# Patient Record
Sex: Male | Born: 1955 | Race: White | Hispanic: No | Marital: Married | Smoking: Current every day smoker
Health system: Southern US, Community
[De-identification: ages and names within clinical notes are randomized; demographics above are authoritative.]

---

## 2009-11-16 ENCOUNTER — Ambulatory Visit: Payer: Self-pay | Admitting: Urology

## 2011-07-23 IMAGING — CT CT ABDOMEN AND PELVIS WITHOUT AND WITH CONTRAST
2 of 4 series · 14 of 32 positions shown, 19 images · non-contrast
Comparison: none

REASON FOR EXAM: hematuria
COMMENTS:

[Series 2: wo · axial · 0.79mm/px · z∈[-492,-112]mm · 8 of 98 slices shown, 13 images]
[im 11/98  soft-tissue]
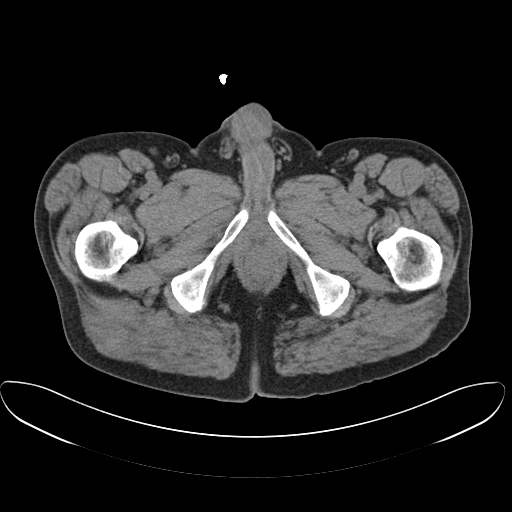
[im 11/98  bone]
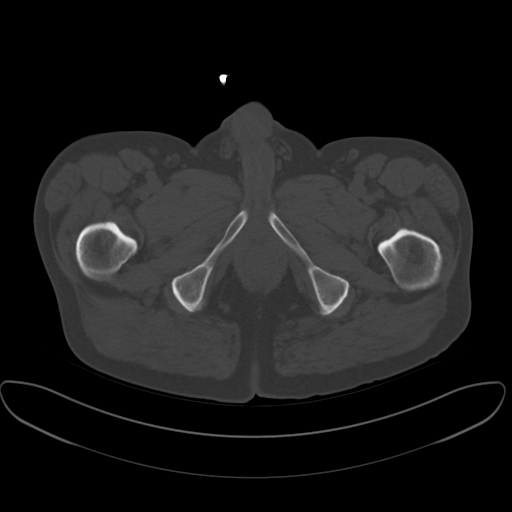
[im 22/98  soft-tissue]
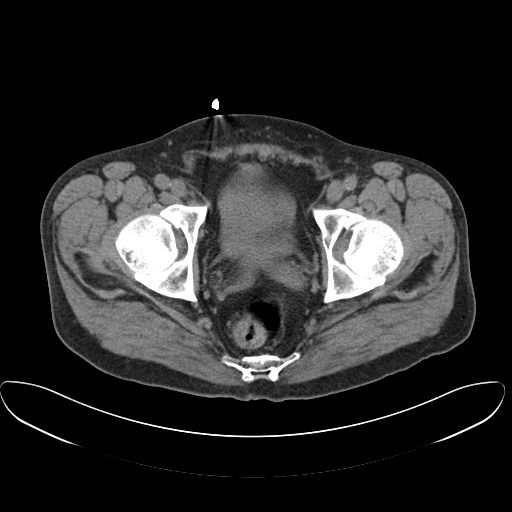
[im 33/98  soft-tissue]
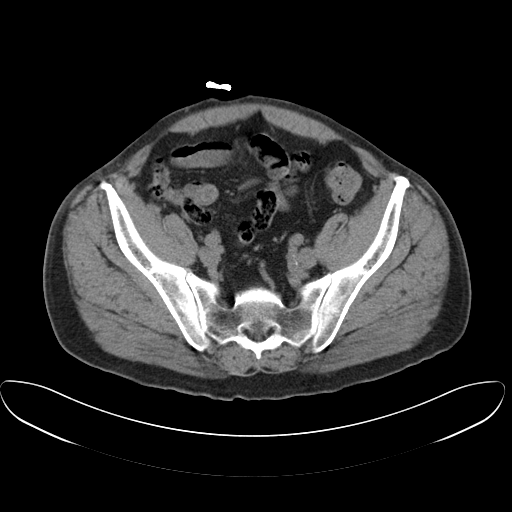
[im 44/98  soft-tissue]
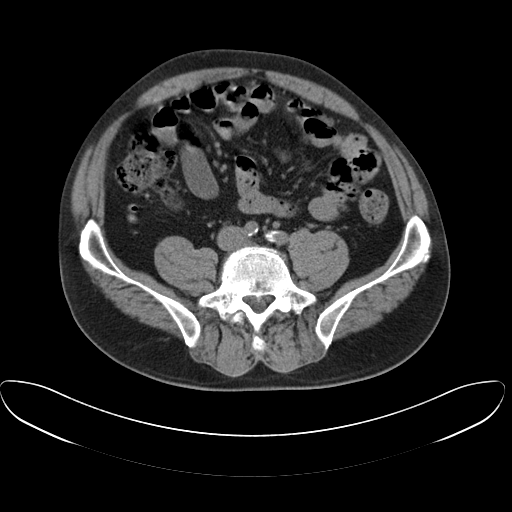
[im 54/98  soft-tissue]
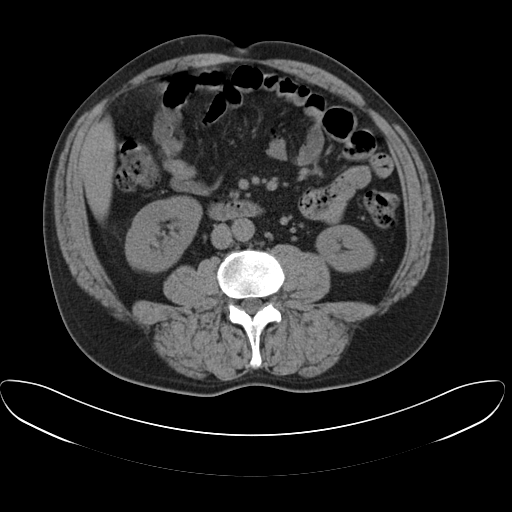
[im 54/98  lung]
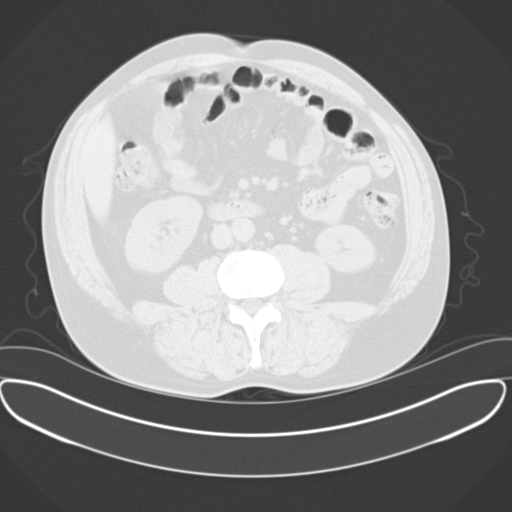
[im 65/98  soft-tissue]
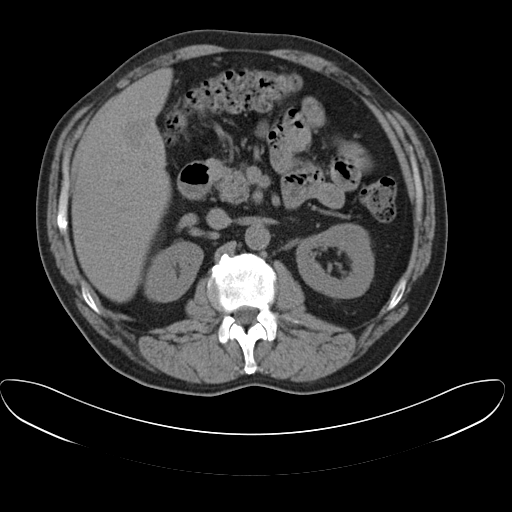
[im 65/98  lung]
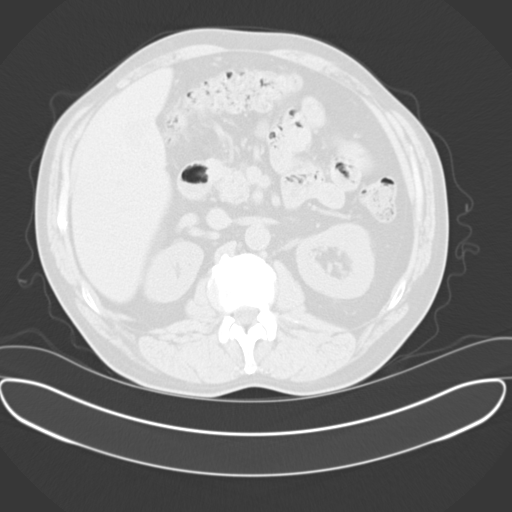
[im 76/98  soft-tissue]
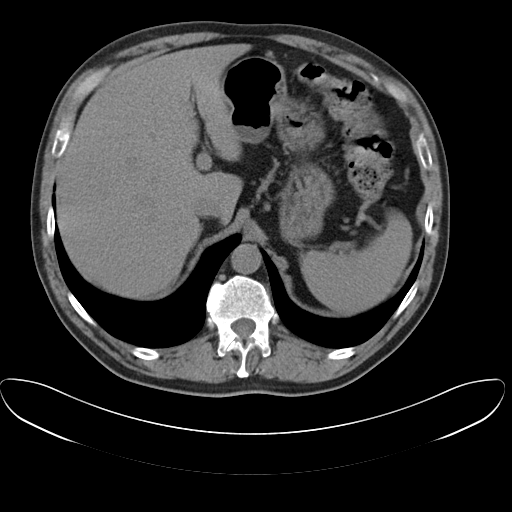
[im 76/98  lung]
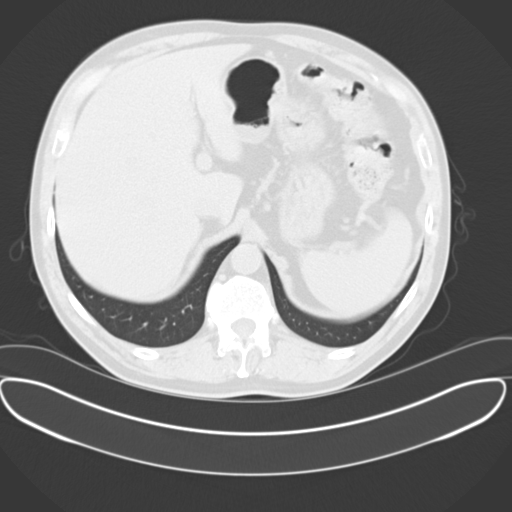
[im 87/98  soft-tissue]
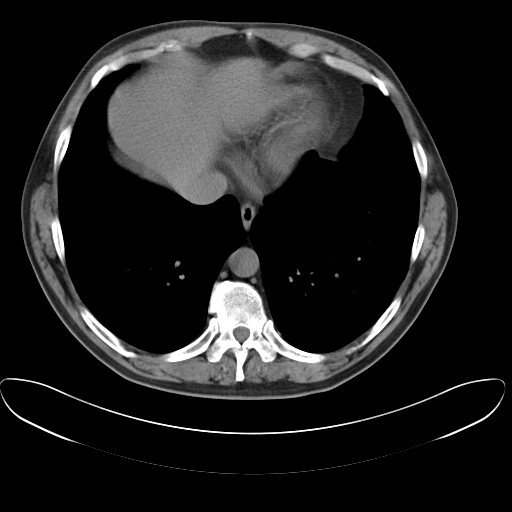
[im 87/98  lung]
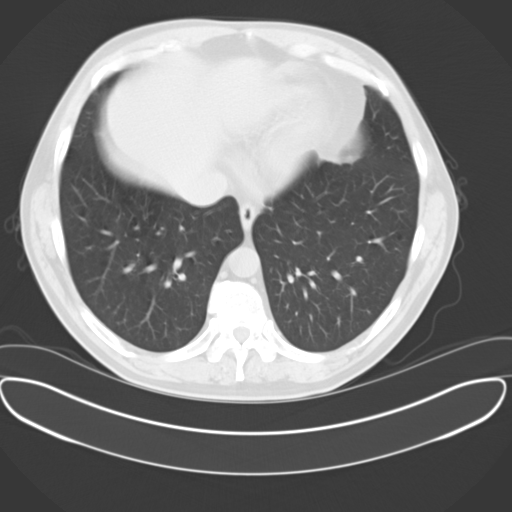

[Series 3: with · axial · 0.79mm/px · z∈[-482,-182]mm · 6 of 98 slices shown]
[im 13/98  soft-tissue]
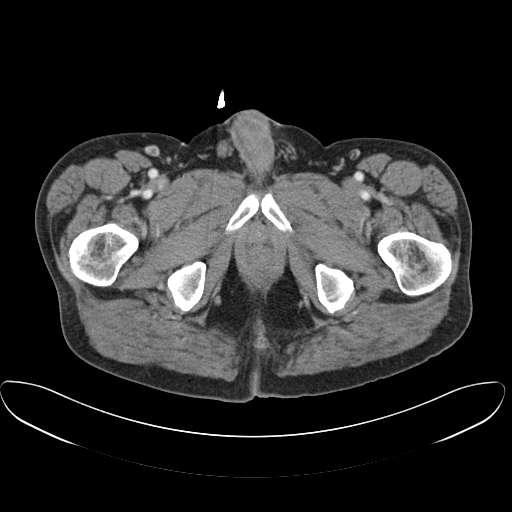
[im 25/98  soft-tissue]
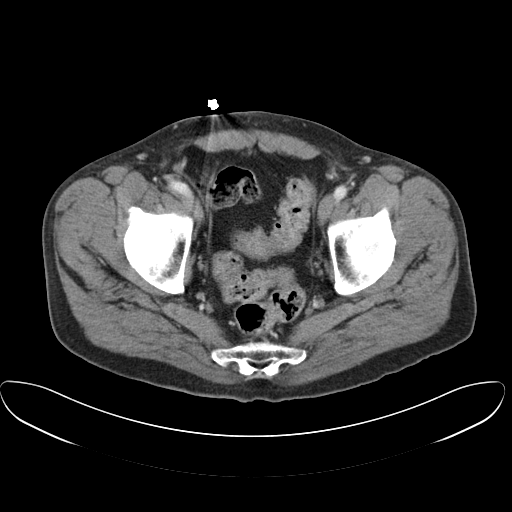
[im 37/98  soft-tissue]
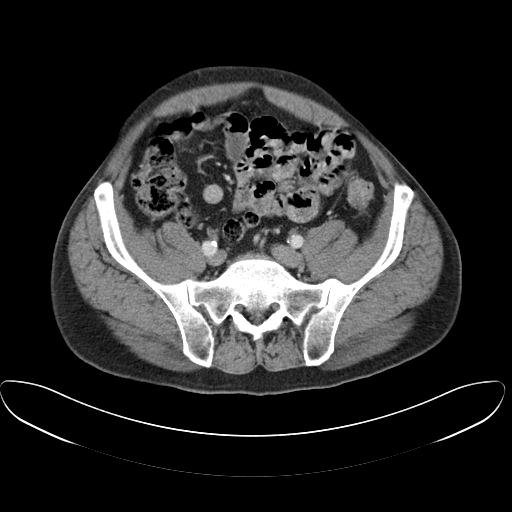
[im 49/98  soft-tissue]
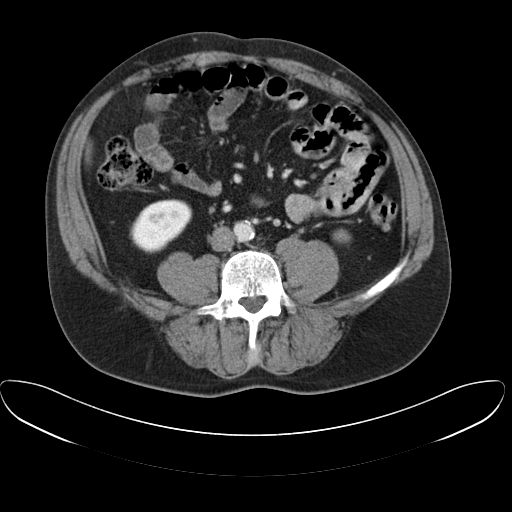
[im 61/98  soft-tissue]
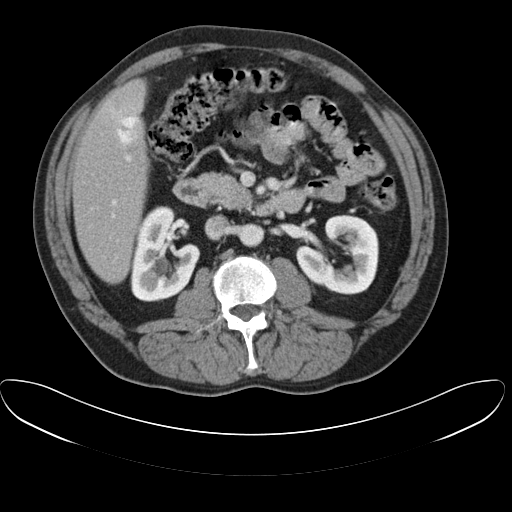
[im 73/98  soft-tissue]
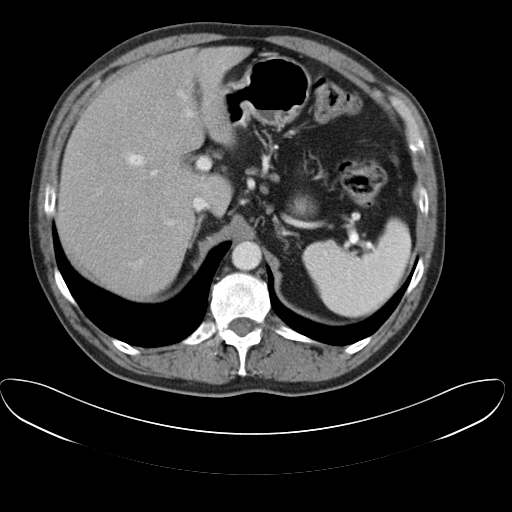

[14 of 32 positions shown; findings below may reference images not displayed]

PROCEDURE:     CT  - CT ABDOMEN / PELVIS  W/WO  - November 16, 2009 [DATE]

RESULT:     A triphasic CT of the abdomen and pelvis is performed. Images
are reconstructed at 5 mm slice thickness in the axial plane. The patient
has no previous exam for comparison.

Noncontrast images demonstrate no nephrolithiasis. No radiopaque gallstones
are evident. The abdominal aortic diameter is normal with minimal
atherosclerotic calcification.

Images obtained after intravenous administration of 100 mL of Ssovue-CCB
iodinated intravenous contrast demonstrate a small parapelvic lesion in the
midposterior right kidney measuring 1.2 cm diameter and demonstrating
Hounsfield reading of 5 consistent with a small cyst best seen on the
postcontrast delayed images on #43. A definite enhancing parenchymal mass is
not evident in either kidney. The adrenal glands, liver, spleen, pancreas,
gallbladder and bowel appear to be within normal limits. The appendix is
normal. There is a normal appearance of the prostate without enlargement. No
ascites or abnormal fluid collection is evident. There does not appear to be
significant diverticulosis. Postcontrast delayed images show urine
opacifying the bladder without a filling defect. The bony structures appear
within normal limits.
IMPRESSION: 1.     Right renal cyst. No other definite abnormality in either kidney.
Otherwise, unremarkable CT.
2.     The lung bases are clear.

## 2016-12-19 ENCOUNTER — Encounter: Payer: Self-pay | Admitting: Emergency Medicine

## 2016-12-19 ENCOUNTER — Emergency Department
Admission: EM | Admit: 2016-12-19 | Discharge: 2016-12-19 | Disposition: A | Discharge status: Home / Self Care | Payer: 59 | Attending: Emergency Medicine | Admitting: Emergency Medicine

## 2016-12-19 DIAGNOSIS — L0231 Cutaneous abscess of buttock: Secondary | ICD-10-CM | POA: Diagnosis present

## 2016-12-19 DIAGNOSIS — L03317 Cellulitis of buttock: Secondary | ICD-10-CM | POA: Diagnosis not present

## 2016-12-19 DIAGNOSIS — F17200 Nicotine dependence, unspecified, uncomplicated: Secondary | ICD-10-CM | POA: Insufficient documentation

## 2016-12-19 DIAGNOSIS — L0291 Cutaneous abscess, unspecified: Secondary | ICD-10-CM

## 2016-12-19 LAB — COMPREHENSIVE METABOLIC PANEL
ALT: 16 U/L — ABNORMAL LOW (ref 17–63)
AST: 21 U/L (ref 15–41)
Albumin: 3.4 g/dL — ABNORMAL LOW (ref 3.5–5.0)
Alkaline Phosphatase: 78 U/L (ref 38–126)
Anion gap: 8 (ref 5–15)
BUN: 19 mg/dL (ref 6–20)
CO2: 26 mmol/L (ref 22–32)
Calcium: 8.7 mg/dL — ABNORMAL LOW (ref 8.9–10.3)
Chloride: 104 mmol/L (ref 101–111)
Creatinine, Ser: 0.66 mg/dL (ref 0.61–1.24)
GFR calc Af Amer: >60 mL/min (ref >60)
GFR calc non Af Amer: >60 mL/min (ref >60)
Glucose, Bld: 111 mg/dL — ABNORMAL HIGH (ref 65–99)
Potassium: 3.5 mmol/L (ref 3.5–5.1)
Sodium: 138 mmol/L (ref 135–145)
Total Bilirubin: 0.6 mg/dL (ref 0.3–1.2)
Total Protein: 6.7 g/dL (ref 6.5–8.1)

## 2016-12-19 LAB — CBC WITH DIFFERENTIAL/PLATELET
Basophils Absolute: 0 10*3/uL (ref 0–0.1)
Basophils Relative: 0 %
Eosinophils Absolute: 0.2 10*3/uL (ref 0–0.7)
Eosinophils Relative: 2 %
HCT: 37.9 % — ABNORMAL LOW (ref 40.0–52.0)
Hemoglobin: 13.2 g/dL (ref 13.0–18.0)
Lymphocytes Relative: 10 %
Lymphs Abs: 1.1 10*3/uL (ref 1.0–3.6)
MCH: 32.8 pg (ref 26.0–34.0)
MCHC: 34.9 g/dL (ref 32.0–36.0)
MCV: 94 fL (ref 80.0–100.0)
Monocytes Absolute: 1.2 10*3/uL — ABNORMAL HIGH (ref 0.2–1.0)
Monocytes Relative: 12 %
Neutro Abs: 8 10*3/uL — ABNORMAL HIGH (ref 1.4–6.5)
Neutrophils Relative %: 76 %
Platelets: 190 10*3/uL (ref 150–440)
RBC: 4.03 MIL/uL — ABNORMAL LOW (ref 4.40–5.90)
RDW: 12.4 % (ref 11.5–14.5)
WBC: 10.6 10*3/uL (ref 3.8–10.6)

## 2016-12-19 MED ORDER — LIDOCAINE HCL 1 % IJ SOLN
5.0000 mL | Freq: Once | INTRAMUSCULAR | Status: AC
Start: 2016-12-19 — End: 2016-12-19
  Administered 2016-12-19: 5 mL
  Filled 2016-12-19: qty 5

## 2016-12-19 MED ORDER — LIDOCAINE HCL (PF) 1 % IJ SOLN
INTRAMUSCULAR | Status: AC
  Administered 2016-12-19: 5 mL
  Filled 2016-12-19: qty 5

## 2016-12-19 MED ORDER — CLINDAMYCIN PHOSPHATE 600 MG/50ML IV SOLN
600.0000 mg | Freq: Once | INTRAVENOUS | Status: AC
  Administered 2016-12-19: 600 mg via INTRAVENOUS
  Filled 2016-12-19: qty 50

## 2016-12-19 MED ORDER — CLINDAMYCIN HCL 300 MG PO CAPS
300.0000 mg | ORAL_CAPSULE | Freq: Three times a day (TID) | ORAL | 0 refills | Status: AC
Start: 2016-12-19 — End: 2016-12-29

## 2016-12-19 NOTE — ED Provider Notes (Signed)
[facility] Emergency Department Provider Note  ____________________________________________  Time seen: Approximately 5:00 PM  I have reviewed the triage vital signs and the nursing notes.   HISTORY  Chief Complaint Abscess    HPI Caleb Martinez is a 61 y.o. male presenting to the emergency department with a left gluteal abscess that became apparent 5 days ago. Patient states that he attempted to seek care from the [facility],who referred him to the emergency department due to size and location of abscess. Patient rates his left abscess pain at 6 out of 10 in intensity. He denies fever or chills. Patient states that abscess has been spontaneously draining bloody serosanguineous exudate. Patient has tried unsuccessfully at self expression. Patient denies associated chest pain, chest tightness, nausea, vomiting and abdominal pain.   History reviewed. No pertinent past medical history.  There are no active problems to display for this patient.   History reviewed. No pertinent surgical history.  Prior to Admission medications   Medication Sig Start Date End Date Taking? Authorizing Provider  clindamycin (CLEOCIN) 300 MG capsule Take 1 capsule (300 mg total) by mouth 3 (three) times daily. 12/19/16 12/29/16  Orvil Feil, PA-C    Allergies Patient has no known allergies.  No family history on file.  Social History Social History  Substance Use Topics  . Smoking status: Current Every Day Smoker  . Smokeless tobacco: Never Used  . Alcohol use No     Review of Systems  Constitutional: No fever/chills Eyes: No visual changes. No discharge ENT: No upper respiratory complaints. Cardiovascular: no chest pain. Respiratory: no cough. No SOB. Gastrointestinal: No abdominal pain.  No nausea, no vomiting.  No diarrhea.  No constipation. Genitourinary: Negative for dysuria. No hematuria Musculoskeletal: Negative for musculoskeletal pain. Skin:  Patient has left gluteal abscess with surrounding cellulitis.  ____________________________________________   PHYSICAL EXAM:  VITAL SIGNS: ED Triage Vitals  Enc Vitals Group     BP 12/19/16 1556 (!) 169/93     Pulse Rate 12/19/16 1556 93     Resp 12/19/16 1556 16     Temp 12/19/16 1556 98.9 F (37.2 C)     Temp Source 12/19/16 1556 Oral     SpO2 12/19/16 1556 98 %     Weight 12/19/16 1557 153 lb 3.2 oz (69.5 kg)     Height --      Head Circumference --      Peak Flow --      Pain Score 12/19/16 1556 6     Pain Loc --      Pain Edu? --      Excl. in GC? --      Constitutional: Alert and oriented. Well appearing and in no acute distress. Eyes: Conjunctivae are normal. PERRL. EOMI. Head: Atraumatic. Cardiovascular: Normal rate, regular rhythm. Normal S1 and S2.  Good peripheral circulation. Respiratory: Normal respiratory effort without tachypnea or retractions. Lungs CTAB. Good air entry to the bases with no decreased or absent breath sounds. Gastrointestinal: Bowel sounds 4 quadrants. Soft and nontender to palpation. No guarding or rigidity. No palpable masses. No distention. No CVA tenderness. Musculoskeletal: Full range of motion to all extremities. No gross deformities appreciated. Neurologic:  Normal speech and language. No gross focal neurologic deficits are appreciated.  Skin: patient has a 3 cm x 3 cm left gluteal abscess with 3 cm of surrounding cellulitis. No streaking. Palpable induration but minimal fluctuance. No induration was palpated around the rectum.  Psychiatric: Mood and affect  are normal. Speech and behavior are normal. Patient exhibits appropriate insight and judgement.   ____________________________________________   LABS (all labs ordered are listed, but only abnormal results are displayed)  Labs Reviewed  CBC WITH DIFFERENTIAL/PLATELET - Abnormal; Notable for the following:       Result Value   RBC 4.03 (*)    HCT 37.9 (*)    Neutro Abs 8.0  (*)    Monocytes Absolute 1.2 (*)    All other components within normal limits  COMPREHENSIVE METABOLIC PANEL - Abnormal; Notable for the following:    Glucose, Bld 111 (*)    Calcium 8.7 (*)    Albumin 3.4 (*)    ALT 16 (*)    All other components within normal limits   ____________________________________________  EKG   ____________________________________________  RADIOLOGY  No results found.  ____________________________________________    PROCEDURES  Procedure(s) performed:    Procedures    Medications  lidocaine (XYLOCAINE) 1 % (with pres) injection 5 mL (5 mLs Infiltration Given 12/19/16 1649)  clindamycin (CLEOCIN) IVPB 600 mg (0 mg Intravenous Stopped 12/19/16 1742)    INCISION AND DRAINAGE Performed by: Orvil FeilJaclyn M Raimundo Corbit Consent: Verbal consent obtained. Risks and benefits: risks, benefits and alternatives were discussed Type: abscess  Body area: Left buttocks.   Anesthesia: local infiltration  Incision was made with a scalpel.  Local anesthetic: lidocaine 1% without epinephrine  Anesthetic total: 5 ml  Complexity: complex Blunt dissection to break up loculations  Drainage: purulent  Drainage amount: 10 cc  Packing material: 1/4 in iodoform gauze  Patient tolerance: Patient tolerated the procedure well with no immediate complications.    ____________________________________________   INITIAL IMPRESSION / ASSESSMENT AND PLAN / ED COURSE  Pertinent labs & imaging results that were available during my care of the patient were reviewed by me and considered in my medical decision making (see chart for details).  Review of the [place] CSRS was performed in accordance of the [facility] prior to dispensing any controlled drugs.     Assessment and plan: Left Buttocks Abscess  Patient presents to the emergency department with a left buttocks abscess with surrounding cellulitis. Patient underwent incision and drainage, yielding bloody purulent exudate.  CBC and CMP reassuring. Patient was given IV clindamycin in the emergency department. Patient was discharged with oral clindamycin. Patient was advised to have packing material removed by primary care in 3 days. Patient voiced understanding regarding these recommendations. Strict return precautions were given. All patient questions were answered.   ____________________________________________  FINAL CLINICAL IMPRESSION(S) / ED DIAGNOSES  Final diagnoses:  Abscess      NEW MEDICATIONS STARTED DURING THIS VISIT:  New Prescriptions   CLINDAMYCIN (CLEOCIN) 300 MG CAPSULE    Take 1 capsule (300 mg total) by mouth 3 (three) times daily.        This chart was dictated using voice recognition software/Dragon. Despite best efforts to proofread, errors can occur which can change the meaning. Any change was purely unintentional.    Orvil FeilWoods, Garcia Dalzell M, PA-C 12/19/16 1750    Minna AntisPaduchowski, Kevin, MD 12/19/16 [phone]

## 2016-12-19 NOTE — ED Triage Notes (Signed)
[facility] abscess on his left buttock drained. Pt states that abscess has been there for a few days, very painful and large.

## 2019-02-22 ENCOUNTER — Other Ambulatory Visit: Payer: Self-pay

## 2019-02-22 DIAGNOSIS — Z20822 Contact with and (suspected) exposure to covid-19: Secondary | ICD-10-CM

## 2019-02-24 LAB — NOVEL CORONAVIRUS, NAA: SARS-CoV-2, NAA: NOT DETECTED

## 2022-05-31 ENCOUNTER — Ambulatory Visit: Payer: No Typology Code available for payment source

## 2022-05-31 DIAGNOSIS — Z23 Encounter for immunization: Secondary | ICD-10-CM
